# Patient Record
Sex: Male | Born: 1995 | Race: White | Hispanic: No | Marital: Single | Smoking: Never smoker
Health system: Southern US, Community
[De-identification: ages and names within clinical notes are randomized; demographics above are authoritative.]

---
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
## 2019-02-11 ENCOUNTER — Other Ambulatory Visit: Payer: BC Managed Care – PPO

## 2019-02-11 ENCOUNTER — Other Ambulatory Visit: Payer: Self-pay

## 2019-02-11 DIAGNOSIS — Z20822 Contact with and (suspected) exposure to covid-19: Secondary | ICD-10-CM

## 2019-02-17 LAB — NOVEL CORONAVIRUS, NAA: SARS-CoV-2, NAA: NOT DETECTED
